# Patient Record
Sex: Female | Born: 1965 | Race: White | Hispanic: No | Marital: Married
Health system: Southern US, Community
[De-identification: ages and names within clinical notes are randomized; demographics above are authoritative.]

---

## 2003-10-09 ENCOUNTER — Encounter: Admission: RE | Admit: 2003-10-09 | Discharge: 2003-10-09 | Payer: Self-pay | Admitting: Internal Medicine

## 2005-03-20 ENCOUNTER — Inpatient Hospital Stay (HOSPITAL_COMMUNITY): Admission: AD | Admit: 2005-03-20 | Discharge: 2005-03-20 | Payer: Self-pay | Admitting: Obstetrics and Gynecology

## 2005-05-31 ENCOUNTER — Inpatient Hospital Stay (HOSPITAL_COMMUNITY): Admission: AD | Admit: 2005-05-31 | Discharge: 2005-06-02 | Payer: Self-pay | Admitting: Obstetrics and Gynecology

## 2005-10-12 ENCOUNTER — Other Ambulatory Visit: Admission: RE | Admit: 2005-10-12 | Discharge: 2005-10-12 | Payer: Self-pay | Admitting: Obstetrics and Gynecology

## 2007-07-04 ENCOUNTER — Ambulatory Visit: Payer: Self-pay | Admitting: Internal Medicine

## 2009-03-23 ENCOUNTER — Ambulatory Visit: Payer: Self-pay

## 2010-06-23 ENCOUNTER — Ambulatory Visit: Payer: Self-pay

## 2012-03-06 ENCOUNTER — Ambulatory Visit: Payer: Self-pay | Admitting: Ophthalmology

## 2012-08-05 ENCOUNTER — Ambulatory Visit: Payer: Self-pay | Admitting: Internal Medicine

## 2012-08-30 ENCOUNTER — Ambulatory Visit: Payer: Self-pay | Admitting: Internal Medicine

## 2012-09-24 ENCOUNTER — Ambulatory Visit: Payer: Self-pay | Admitting: Surgery

## 2013-08-20 IMAGING — US ULTRASOUND LEFT BREAST
1 series · 9 of 9 positions shown · non-contrast
Comparison: none

REASON FOR EXAM: av lt small asymmetry
COMMENTS:

PROCEDURE:     US  - US BREAST LEFT  - August 30, 2012  [DATE]
RESULT:     TECHNIQUE: Digital diagnostic left mammograms were obtained. FDA
approved computer-aided detection (CAD) for mammography was utilized for
this study.

[Series 1: ultrasound left breast · 0.09mm/px · 9 of 9 slices shown]
[im 1/9]
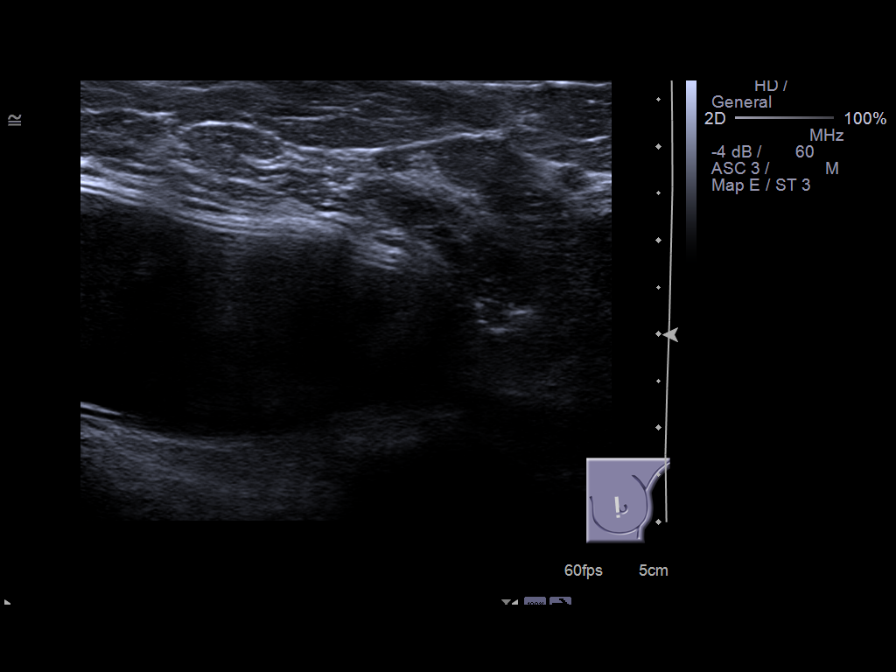
[im 2/9]
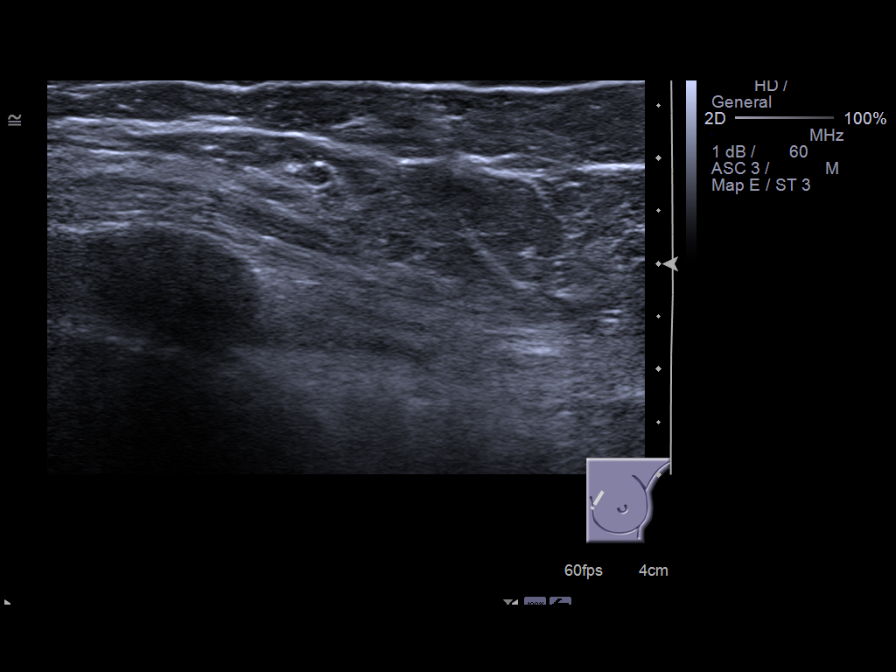
[im 3/9]
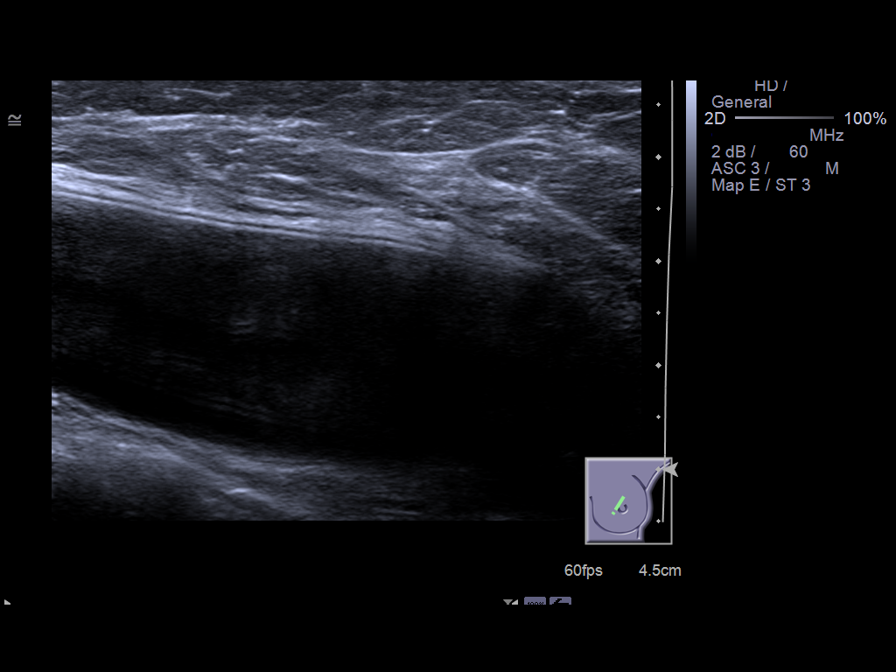
[im 4/9]
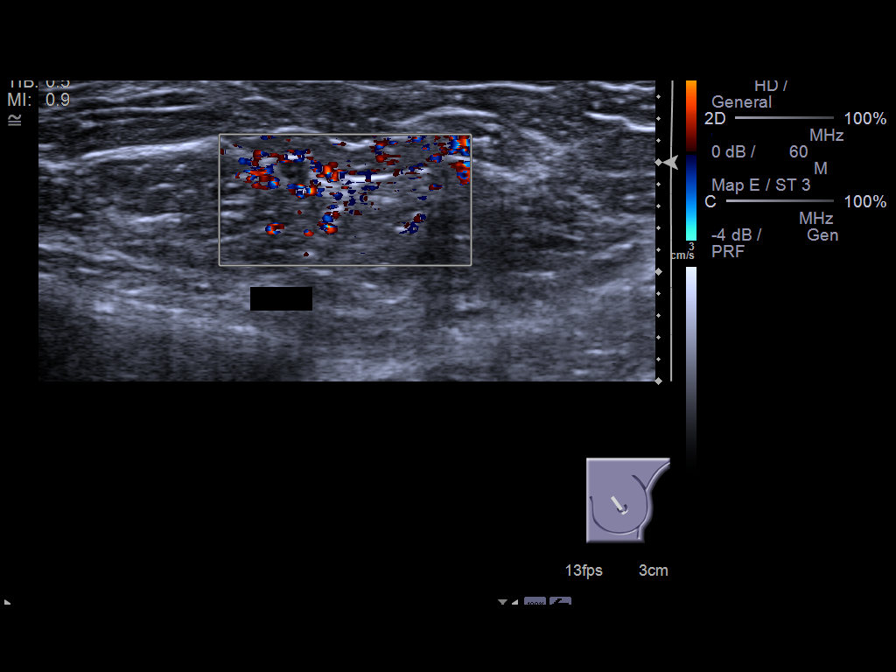
[im 5/9]
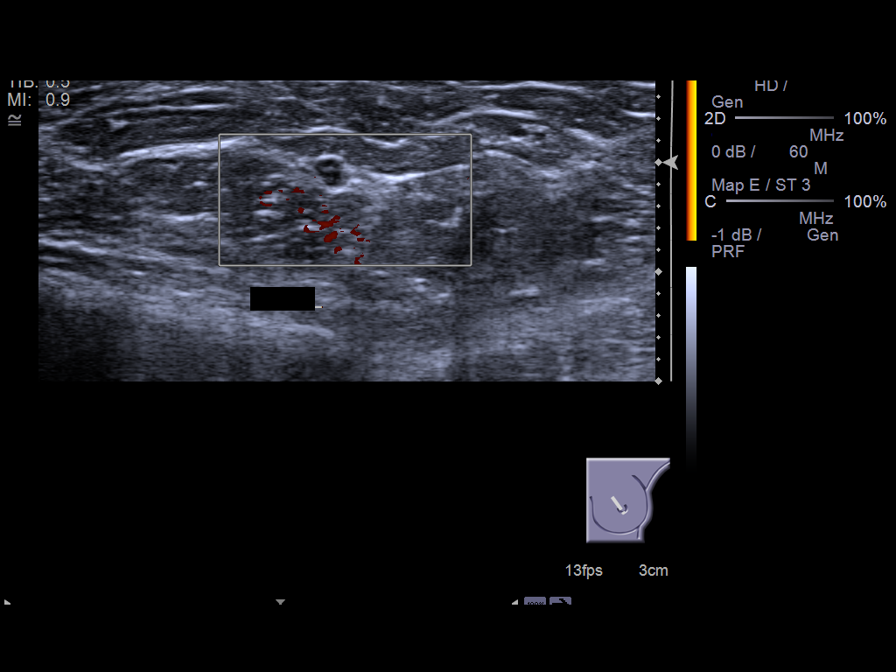
[im 6/9]
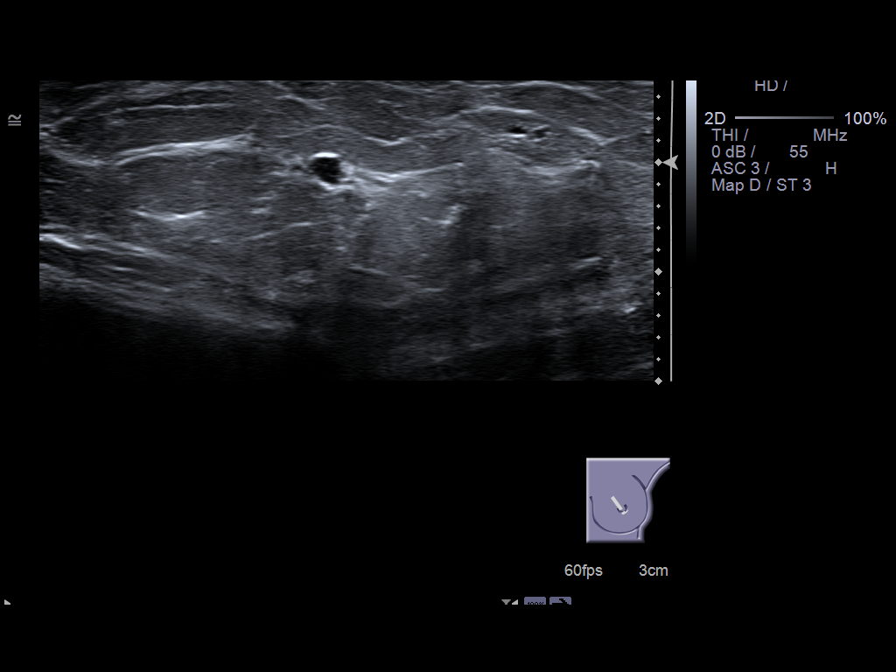
[im 7/9]
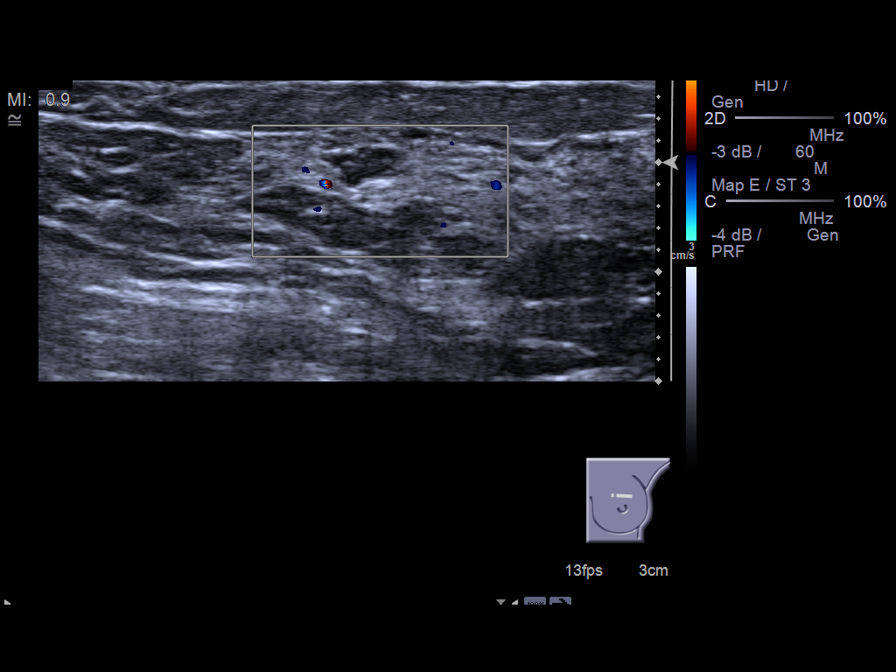
[im 8/9]
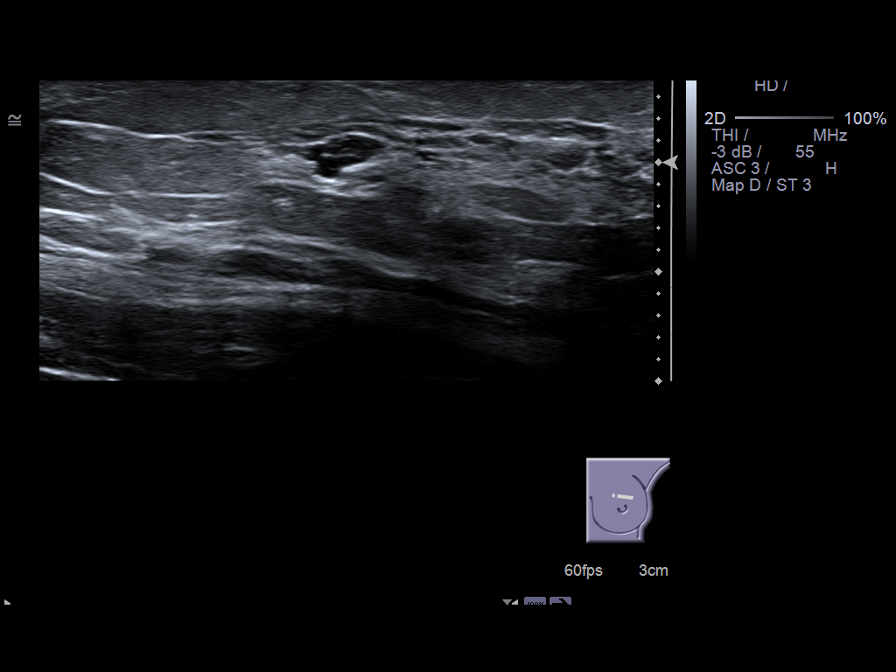
[im 9/9]
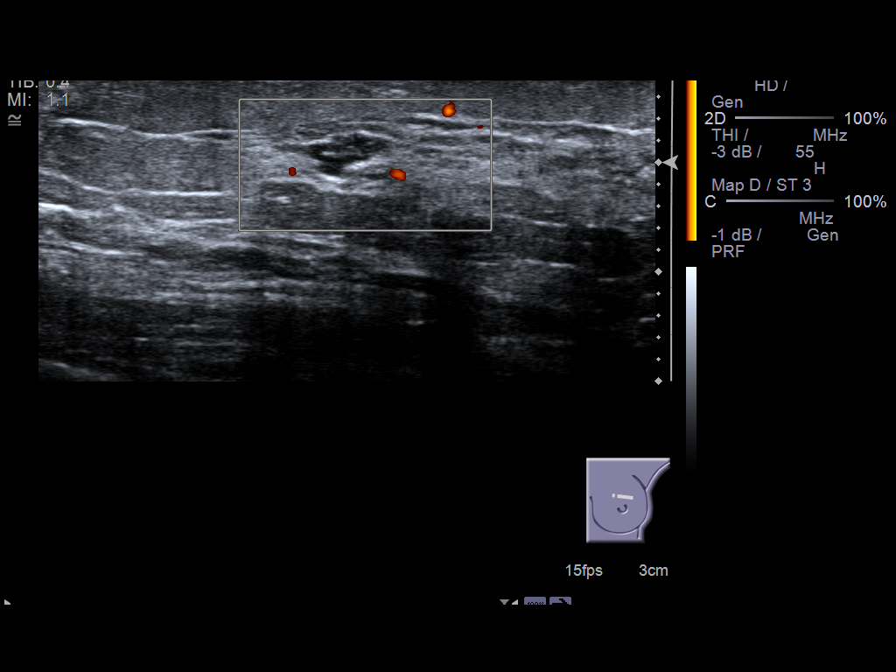

[9 of 9 positions shown; findings below may reference images not displayed]

FINDING: True lateral view and spot compression views of the left breast were
performed. There is a focal asymmetry in the upper left breast at
approximately the 11-12:00 position in the posterior third of the breast
parenchyma. There is no architectural distortion or clusters of suspicious
microcalcifications.

Real-time sonography of the left breast was performed from the [DATE]
position to the [DATE] position. At the [DATE] position there is a 0.7 x 0.4 x
0.5 cm irregularly marginated hypoechoic mass without internal Doppler flow.
This corresponds with the mammographic abnormality.

At the [DATE] position there is a small hypoechoic nodule measuring 3.9 x
x 2.5 mm.
IMPRESSION: 1.     There are 2 indeterminate hypoechoic left breast nodules, as
described above, with the largest corresponding with the mammographic
abnormality. Tissue diagnosis is recommended.

BI-RADS:  Category 4 - Suspicious Abnormality

A negative mammogram report does not preclude biopsy or other evaluation of
a clinically palpable or otherwise suspicious mass or lesion. Breast cancer
may not be detected by mammography in up to 10% of cases.

[REDACTED]

## 2014-10-30 IMAGING — MG MM CAD SCREENING MAMMO
3 series · 8 of 8 positions shown · non-contrast
Comparison: none

REASON FOR EXAM: SCR MAMMO NO ORDER IMPLANTS
COMMENTS:

PROCEDURE:     MAM - MAM DGTL SCRN MAM NO ORDER W/CAD  - August 05, 2012 [DATE]
RESULT:     COMPARISON:  07/04/2007, 03/23/2009, 06/23/2010
TECHNIQUE: Digital screening mammograms were obtained. FDA approved
computer-aided detection (CAD) for mammography was utilized for this study.

[R CC · right · 6 of 10 slices shown]
[im 1/10]
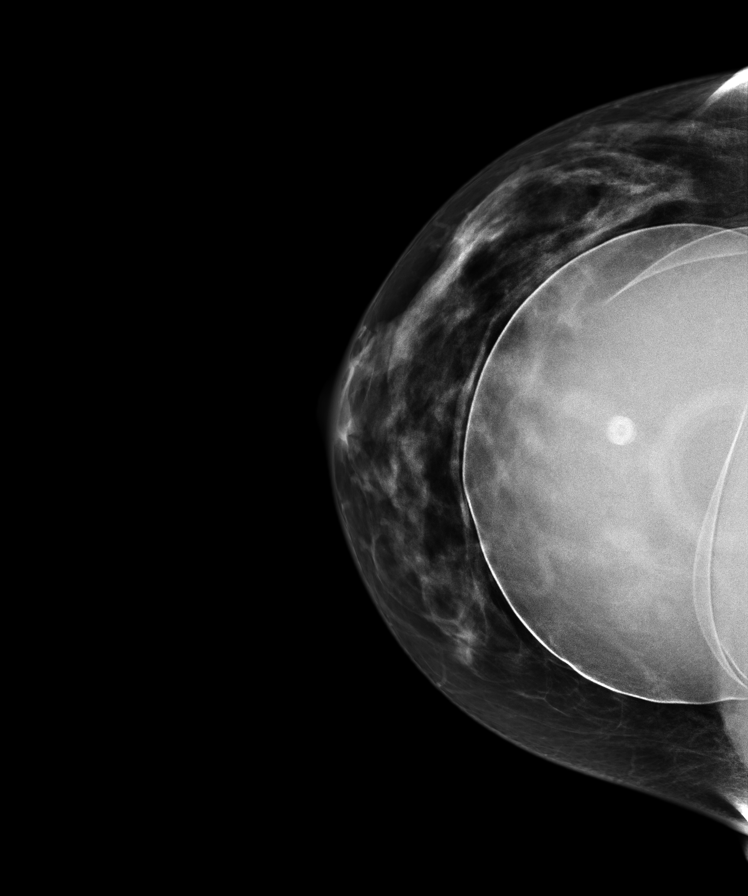
[im 2/10]
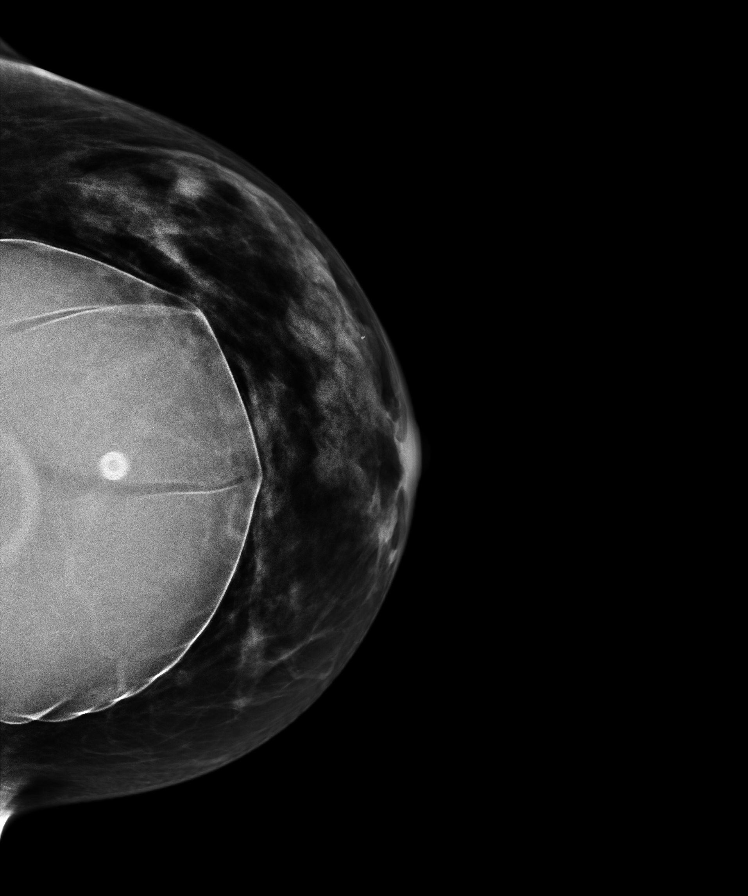
[im 4/10]
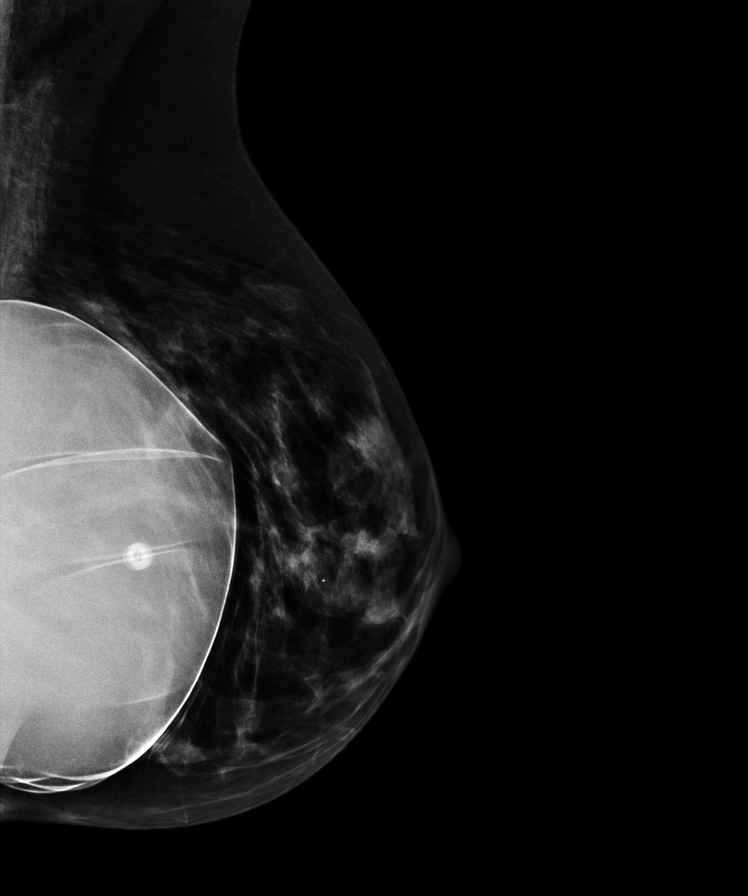
[im 6/10]
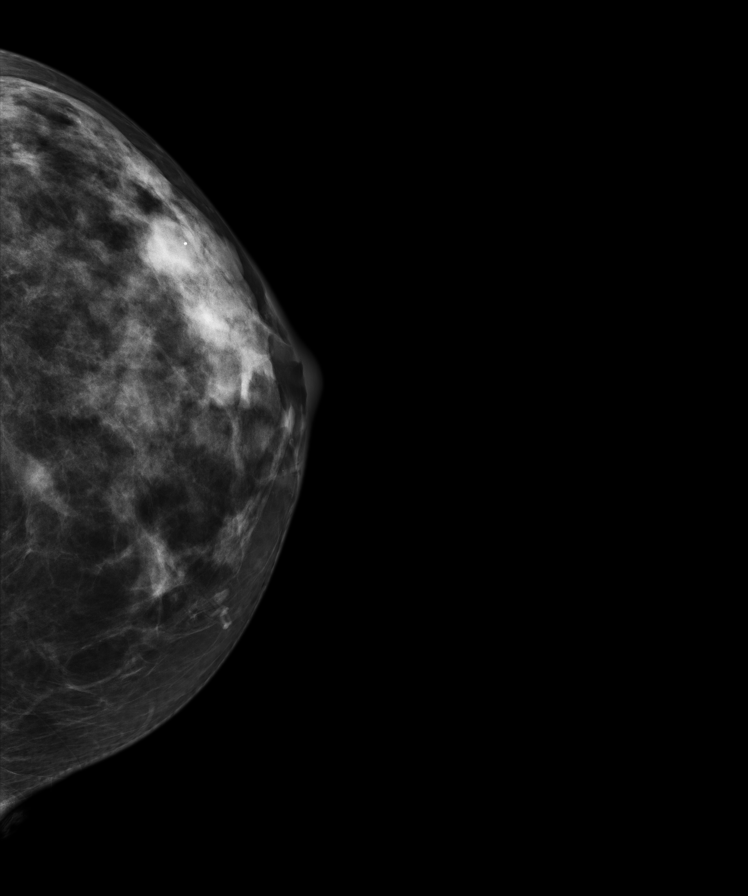
[im 8/10]
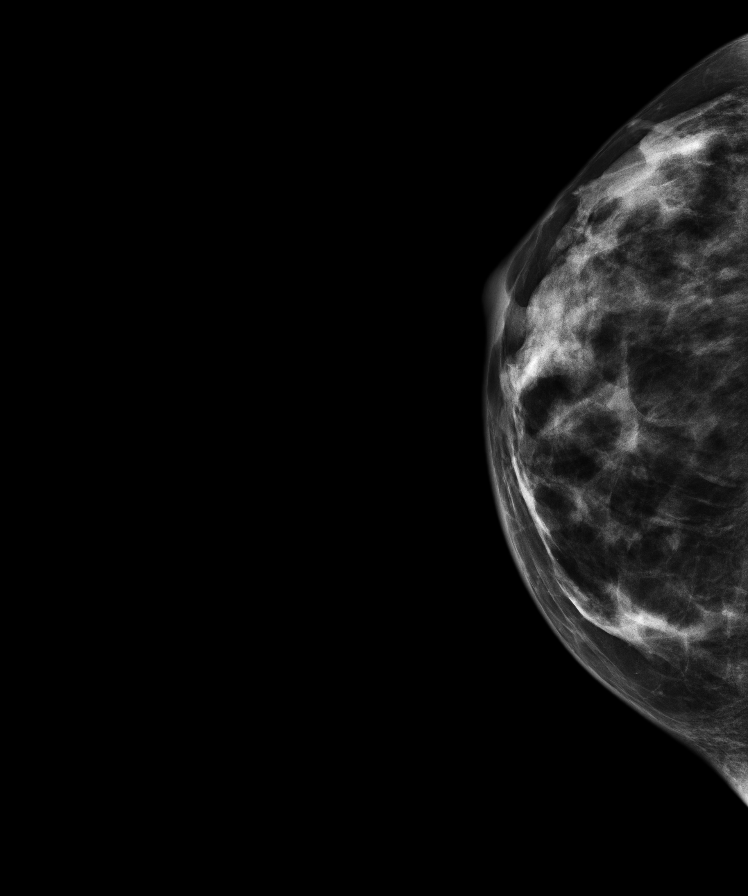
[im 10/10]
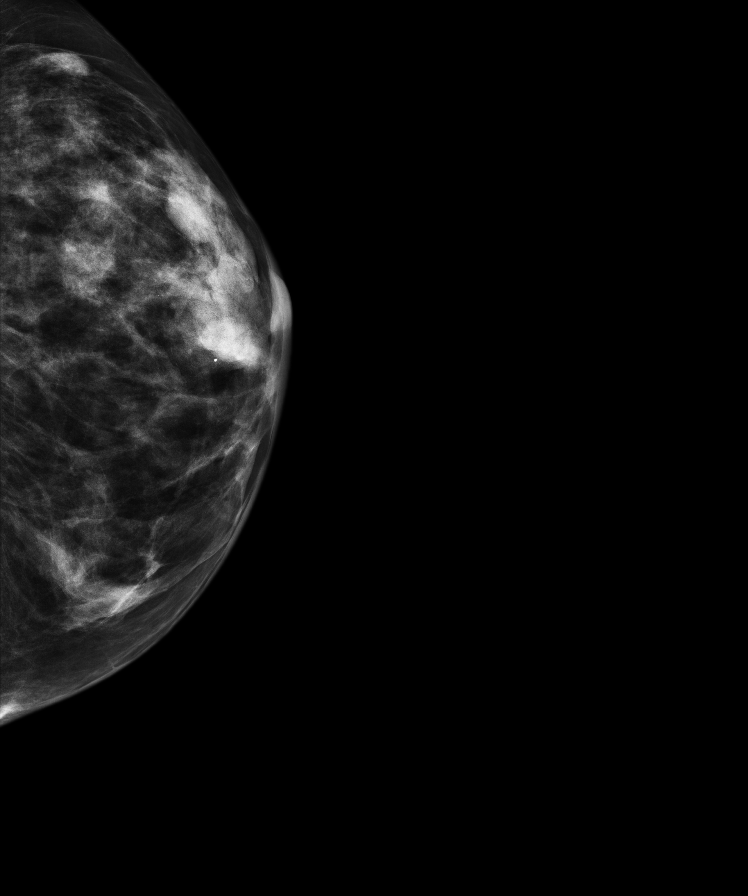

[L CC]
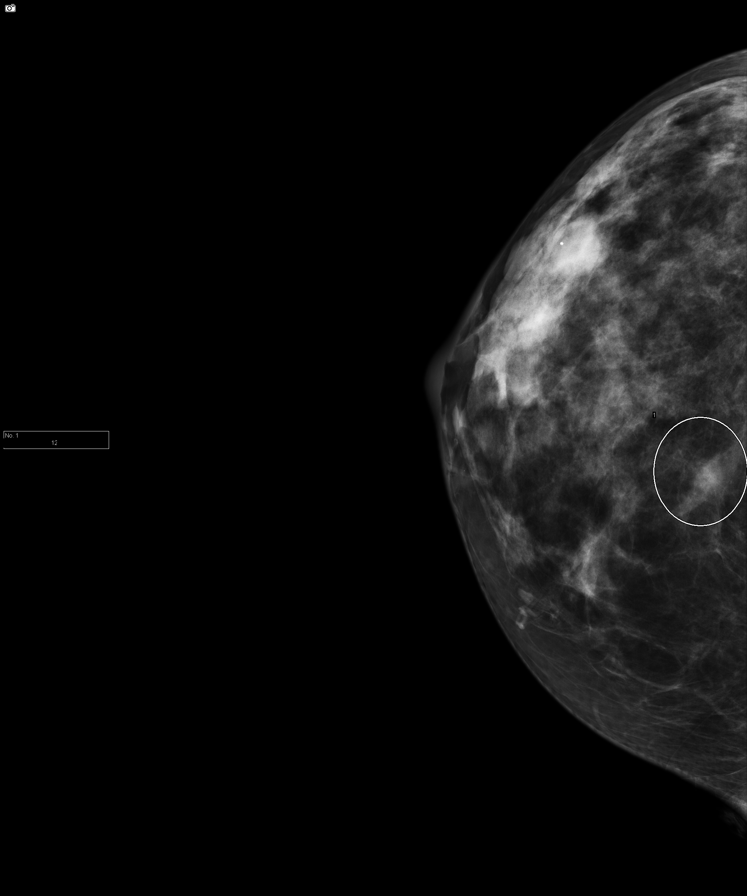

[L MLO]
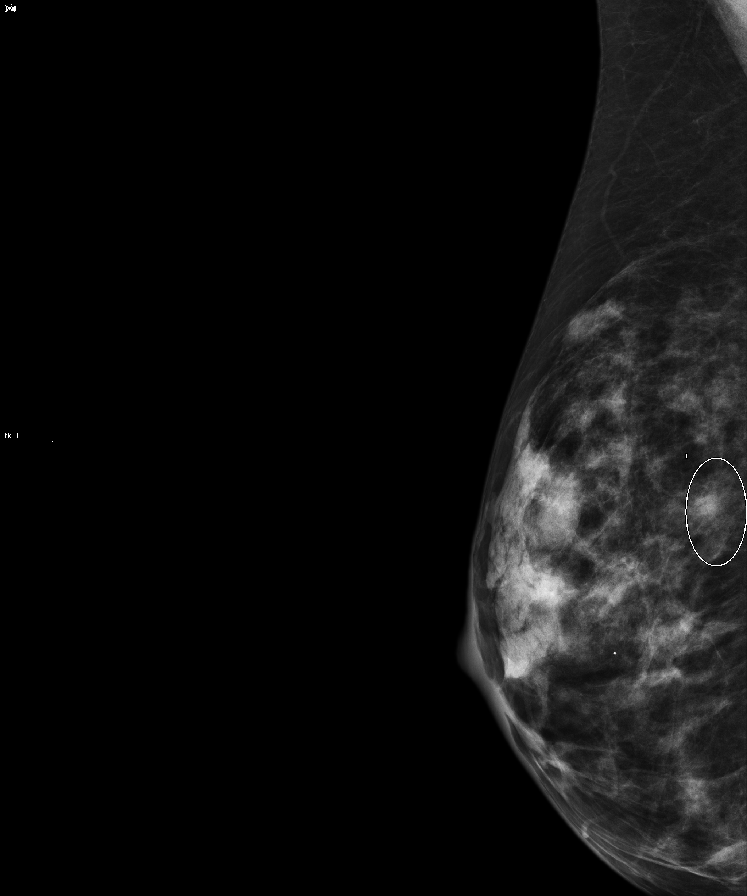

[8 of 8 positions shown; findings below may reference images not displayed]

FINDING: Bilateral breasts are heterogeneously dense which may lower the sensitivity
of mammography. There are bilateral breast implants noted which also lower
the sensitivity of mammography. There is an asymmetry in the superior medial
left breast. There is no dominant mass, architectural distortion or clusters
of suspicious microcalcifications.
IMPRESSION: 1.    Small asymmetry in the superior medial left breast. Recommend spot
compression views.

BI-RADS:  Category 0 - Needs Additional Imaging Evaluation

A negative mammogram report does not preclude biopsy or other evaluation of
a clinically palpable or otherwise suspicious mass or lesion. Breast cancer
may not be detected by mammography in up to 10% of cases.

[REDACTED]

## 2014-12-08 NOTE — Op Note (Signed)
PATIENT NAME:  Elizabeth Knight, Nimrit MR#:  [id number] DATE OF BIRTH:  03/01/1966  DATE OF PROCEDURE:  03/06/2012  PROCEDURES PERFORMED: 1. Pars plana vitrectomy of the left eye.  2. Internal limiting membrane peel of the left eye.   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  ESTIMATED BLOOD LOSS: Less than 1 mL.   PRIMARY SURGEON: Ignacia FellingMatthew F. Sayana Salley, MD  ANESTHESIA: Retrobulbar block of the left eye with monitored anesthesia care.   COMPLICATIONS: None.   INDICATIONS FOR PROCEDURE: This is a patient who presented to my office with decreasing vision in the left eye. 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 Risks, benefits, and alternatives of the above procedure were discussed and the patient wished to proceed.   DETAILS OF PROCEDURE: After informed consent was obtained, the patient was brought into operative suite at [facility]. Patient was placed in supine position, was given a small dose of propofol and a retrobulbar block was performed on the left eye by the primary surgeon without any complications. The left eye was prepped and draped in sterile manner. After lid speculum was inserted, a 25-gauge trocar was placed 4 mm beyond the limbus in the inferotemporal quadrant through displaced conjunctiva in an oblique fashion. The infusion cannula was turned on and inserted through the trocar and secured in position with Steri-Strips. Two more trocars were placed in a similar fashion superotemporally and superonasally. Vitreous cutter and light pipe were introduced into the eye and a core vitrectomy was performed. The vitreous face was confirmed to be elevated. Peripheral vitreous was trimmed for 360 degrees. Indocyanine green was injected onto the posterior pole and removed within 30 seconds. Internal limiting membrane peel was performed for 360 degrees  around the fovea for a total diameter of approximately 1.5 to 2 disk diameters. A scleral depressed exam was performed for 360 degrees and no signs of any breaks or tears could be identified. Complete air-fluid exchange was performed. Four minutes was allowed to elapse for dehydration of the vitreous base. This remnant fluid was removed and then 22% SF6 was used as an Systems developerair-gas exchange. The trocars were removed and the wounds were noted be airtight. Pressure in the eye was confirmed to be approximately 15 mmHg. 5 mg of dexamethasone was given into the inferior fornix and the lid speculum was removed. The eye was cleaned and TobraDex was placed in the eye. A patch and shield were placed over the eye and the patient was taken to postanesthesia care with instructions face down.   ____________________________ Ignacia FellingMatthew F. Burlin Mcnair, MD mfa:cms D: 03/06/2012 12:05:29 ET T: 03/06/2012 12:32:51 ET JOB#: [id number]  cc: Ignacia FellingMatthew F. Champ MungoAppenzeller, MD, <Dictator>  Cline CoolsMATTHEW F Kae Lauman MD ELECTRONICALLY SIGNED 04/10/2012 12:35

## 2018-07-02 ENCOUNTER — Ambulatory Visit: Payer: Self-pay | Admitting: Physician Assistant

## 2018-07-02 DIAGNOSIS — Z111 Encounter for screening for respiratory tuberculosis: Secondary | ICD-10-CM

## 2018-07-02 NOTE — Patient Instructions (Signed)
Tuberculin Skin Test  Why am I having this test?  Tuberculosis (TB) is a bacterial infection caused by Mycobacterium tuberculosis. Most people who are exposed to these bacteria have a strong enough defense (immune) system to prevent the bacteria from causing TB and developing symptoms. Their bodies prevent the germs from being active and making them sick (latent TB infection).  However, if you have TB germs in your body and your immune system is weak, you can develop a TB infection. This can cause symptoms such as:  · Night sweats.  · Fever.  · Weakness.  · Weight loss.    A latent TB infection can also become active later in life if your immune system becomes weakened or compromised.  You may have this test if your health care provider suspects that you have TB. You may also have this test to screen for TB if you are at risk for getting the disease. Those at increased risk include:  · People who inject illegal drugs or share needles.  · People with HIV or other diseases that affect immunity.  · Health care workers.  · People who live in high-risk communities, such as homeless shelters, nursing homes, and correctional facilities.  · People who have been in contact with someone with TB.  · People from countries where TB is more common.    If you are in a high-risk group, your health care provider may wish to screen for TB more often. This can help prevent the spread of the disease. Sometimes TB screening is required when starting a new job, such as becoming a health care worker or a teacher. Colleges or universities may require it of new students.  What is being tested?  A tuberculin skin test is the main test used to check for exposure to the bacteria that can cause TB. The test checks for antibodies to the bacteria. Antibodies are proteins that your body produces to protect you from germs and other things that can make you sick.  Your health care provider will inject a solution known as PPD (purified  protein derivative) under the first layer of skin on your arm. This causes a blister-like bubble to form at the site. Your health care provider will then examine the site after a number of hours have passed to see if a reaction has occurred.  How do I prepare for this test?  There is no preparation required for this test.  What do the results mean?  Your test results will be reported as either negative or positive.  If the tuberculin skin test produces a negative result, it is likely that you do not have TB and have not been exposed to the TB bacteria.  If you or your health care provider suspects exposure, however, you may want to repeat the test a few weeks later. A blood test may also be used to check for TB. This is because you will not react to the tuberculin skin test until several weeks after exposure to TB bacteria.  If you test positive to the tuberculin skin test, it is likely that you have been exposed to TB bacteria. The test does not distinguish between an active and a latent TB infection.  A false-positive result can occur. A false-positive result for TB bacteria is incorrect because it indicates a condition or finding is present when it is not.  Talk to your health care provider to discuss your results, treatment options, and if necessary, the need for more tests.    It is your responsibility to obtain your test results. Ask the lab or department performing the test when and how you will get your results. Talk with your health care provider if you have any questions about your results.  Talk with your health care provider to discuss your results, treatment options, and if necessary, the need for more tests. Talk with your health care provider if you have any questions about your results.  This information is not intended to replace advice given to you by your health care provider. Make sure you discuss any questions you have with your health care provider.   Document Released: 05/17/2005 Document Revised: 04/09/2016 Document Reviewed: 12/01/2013  Elsevier Interactive Patient Education © 2018 [employer]

## 2018-07-02 NOTE — Progress Notes (Signed)
PPD Placement note Elizabeth DickensStephanie A Knight, 52 y.o. female is here today for placement of PPD test Reason for PPD test: Work requested Pt taken PPD test before: yes Verified in allergy area and with patient that they are not allergic to the products PPD is made of (Phenol or Tween).yesIs patient taking any oral or IV steroid medication now or have they taken it in the last month?no Has the patient ever received the BCG vaccine?:no Has the patient been in recent contact with anyone known or suspected of having active TB disease?:no     O: Alert and oriented in NAD. P:  PPD placed on 07/02/2018.  Patient advised to return for reading within 48-72 hours.  Yvonne KendallSandra Dennis Killilea, RMA Registered Medical Assistant [facility] [phone]

## 2018-07-04 LAB — TB SKIN TEST
Induration: 0 mm
TB SKIN TEST: NEGATIVE

## 2018-07-04 NOTE — Progress Notes (Signed)
PPD Reading Note PPD read and results entered in [employer]. Result: 0 mm induration. Interpretation:negative IAllergic reaction: no   Scotti Kosta, RMA Registered Medical Assistant [facility] [phone] 

## 2020-05-31 ENCOUNTER — Ambulatory Visit (LOCAL_COMMUNITY_HEALTH_CENTER): Payer: Self-pay

## 2020-05-31 ENCOUNTER — Other Ambulatory Visit: Payer: Self-pay

## 2020-05-31 DIAGNOSIS — Z111 Encounter for screening for respiratory tuberculosis: Secondary | ICD-10-CM

## 2020-06-03 ENCOUNTER — Other Ambulatory Visit: Payer: Self-pay

## 2020-06-03 ENCOUNTER — Ambulatory Visit (LOCAL_COMMUNITY_HEALTH_CENTER): Payer: BC Managed Care – PPO

## 2020-06-03 DIAGNOSIS — Z111 Encounter for screening for respiratory tuberculosis: Secondary | ICD-10-CM

## 2020-06-03 LAB — TB SKIN TEST
Induration: 0 mm
TB Skin Test: NEGATIVE
